# Patient Record
Sex: Female | Born: 1965 | Race: Black or African American | Hispanic: No | Marital: Single | Smoking: Never smoker
Health system: Southern US, Community
[De-identification: ages and names within clinical notes are randomized; demographics above are authoritative.]

## PROBLEM LIST (undated history)

## (undated) DIAGNOSIS — F329 Major depressive disorder, single episode, unspecified: Secondary | ICD-10-CM

## (undated) DIAGNOSIS — K219 Gastro-esophageal reflux disease without esophagitis: Secondary | ICD-10-CM

## (undated) DIAGNOSIS — K449 Diaphragmatic hernia without obstruction or gangrene: Secondary | ICD-10-CM

## (undated) DIAGNOSIS — E119 Type 2 diabetes mellitus without complications: Secondary | ICD-10-CM

## (undated) DIAGNOSIS — F411 Generalized anxiety disorder: Secondary | ICD-10-CM

## (undated) DIAGNOSIS — F988 Other specified behavioral and emotional disorders with onset usually occurring in childhood and adolescence: Secondary | ICD-10-CM

## (undated) DIAGNOSIS — D649 Anemia, unspecified: Secondary | ICD-10-CM

## (undated) DIAGNOSIS — E559 Vitamin D deficiency, unspecified: Secondary | ICD-10-CM

## (undated) DIAGNOSIS — G894 Chronic pain syndrome: Secondary | ICD-10-CM

## (undated) DIAGNOSIS — M797 Fibromyalgia: Secondary | ICD-10-CM

## (undated) DIAGNOSIS — E282 Polycystic ovarian syndrome: Secondary | ICD-10-CM

## (undated) DIAGNOSIS — F410 Panic disorder [episodic paroxysmal anxiety] without agoraphobia: Secondary | ICD-10-CM

## (undated) DIAGNOSIS — B002 Herpesviral gingivostomatitis and pharyngotonsillitis: Secondary | ICD-10-CM

## (undated) HISTORY — DX: Other specified behavioral and emotional disorders with onset usually occurring in childhood and adolescence: F98.8

## (undated) HISTORY — DX: Polycystic ovarian syndrome: E28.2

## (undated) HISTORY — PX: KNEE ARTHROSCOPY W/ MENISCAL REPAIR: SHX1877

## (undated) HISTORY — DX: Major depressive disorder, single episode, unspecified: F32.9

## (undated) HISTORY — DX: Gastro-esophageal reflux disease without esophagitis: K21.9

## (undated) HISTORY — DX: Diaphragmatic hernia without obstruction or gangrene: K44.9

## (undated) HISTORY — DX: Panic disorder (episodic paroxysmal anxiety): F41.0

## (undated) HISTORY — DX: Generalized anxiety disorder: F41.1

## (undated) HISTORY — DX: Chronic pain syndrome: G89.4

## (undated) HISTORY — DX: Vitamin D deficiency, unspecified: E55.9

## (undated) HISTORY — DX: Herpesviral gingivostomatitis and pharyngotonsillitis: B00.2

## (undated) HISTORY — DX: Anemia, unspecified: D64.9

## (undated) HISTORY — PX: GASTRIC BYPASS: SHX52

---

## 1994-01-21 ENCOUNTER — Encounter: Payer: Self-pay | Admitting: Gastroenterology

## 1999-08-11 ENCOUNTER — Other Ambulatory Visit: Admission: RE | Admit: 1999-08-11 | Discharge: 1999-08-11 | Payer: Self-pay | Admitting: Family Medicine

## 2000-07-01 ENCOUNTER — Encounter: Payer: Self-pay | Admitting: *Deleted

## 2000-07-01 ENCOUNTER — Encounter: Admission: RE | Admit: 2000-07-01 | Discharge: 2000-07-01 | Payer: Self-pay | Admitting: *Deleted

## 2000-07-03 ENCOUNTER — Emergency Department (HOSPITAL_COMMUNITY): Admission: EM | Admit: 2000-07-03 | Discharge: 2000-07-03 | Payer: Self-pay | Admitting: Emergency Medicine

## 2000-07-23 ENCOUNTER — Emergency Department (HOSPITAL_COMMUNITY): Admission: EM | Admit: 2000-07-23 | Discharge: 2000-07-23 | Payer: Self-pay | Admitting: Internal Medicine

## 2000-07-29 ENCOUNTER — Encounter: Admission: RE | Admit: 2000-07-29 | Discharge: 2000-07-29 | Payer: Self-pay | Admitting: Family Medicine

## 2000-07-29 ENCOUNTER — Encounter: Payer: Self-pay | Admitting: Family Medicine

## 2000-09-28 ENCOUNTER — Other Ambulatory Visit: Admission: RE | Admit: 2000-09-28 | Discharge: 2000-09-28 | Payer: Self-pay | Admitting: Family Medicine

## 2000-10-27 ENCOUNTER — Encounter: Admission: RE | Admit: 2000-10-27 | Discharge: 2001-01-25 | Payer: Self-pay | Admitting: Pulmonary Disease

## 2001-01-12 ENCOUNTER — Ambulatory Visit (HOSPITAL_BASED_OUTPATIENT_CLINIC_OR_DEPARTMENT_OTHER): Admission: RE | Admit: 2001-01-12 | Discharge: 2001-01-12 | Payer: Self-pay | Admitting: Pulmonary Disease

## 2001-10-13 ENCOUNTER — Encounter: Payer: Self-pay | Admitting: Family Medicine

## 2001-10-13 ENCOUNTER — Encounter: Admission: RE | Admit: 2001-10-13 | Discharge: 2001-10-13 | Payer: Self-pay | Admitting: Family Medicine

## 2001-10-27 ENCOUNTER — Encounter: Admission: RE | Admit: 2001-10-27 | Discharge: 2002-01-25 | Payer: Self-pay | Admitting: Pulmonary Disease

## 2002-05-31 ENCOUNTER — Encounter: Admission: RE | Admit: 2002-05-31 | Discharge: 2002-08-29 | Payer: Self-pay | Admitting: Pulmonary Disease

## 2002-08-15 ENCOUNTER — Other Ambulatory Visit: Admission: RE | Admit: 2002-08-15 | Discharge: 2002-08-15 | Payer: Self-pay | Admitting: Family Medicine

## 2002-08-22 ENCOUNTER — Encounter: Admission: RE | Admit: 2002-08-22 | Discharge: 2002-08-22 | Payer: Self-pay | Admitting: Family Medicine

## 2002-08-22 ENCOUNTER — Encounter: Payer: Self-pay | Admitting: Family Medicine

## 2002-09-07 ENCOUNTER — Encounter: Admission: RE | Admit: 2002-09-07 | Discharge: 2002-12-06 | Payer: Self-pay | Admitting: Pulmonary Disease

## 2002-10-10 ENCOUNTER — Emergency Department (HOSPITAL_COMMUNITY): Admission: EM | Admit: 2002-10-10 | Discharge: 2002-10-11 | Payer: Self-pay

## 2003-10-23 ENCOUNTER — Other Ambulatory Visit: Admission: RE | Admit: 2003-10-23 | Discharge: 2003-10-23 | Payer: Self-pay | Admitting: Family Medicine

## 2003-11-02 ENCOUNTER — Encounter: Admission: RE | Admit: 2003-11-02 | Discharge: 2003-11-02 | Payer: Self-pay | Admitting: Family Medicine

## 2004-04-22 ENCOUNTER — Encounter: Admission: RE | Admit: 2004-04-22 | Discharge: 2004-04-22 | Payer: Self-pay | Admitting: Orthopedic Surgery

## 2004-04-24 ENCOUNTER — Ambulatory Visit: Payer: Self-pay | Admitting: Family Medicine

## 2004-05-02 ENCOUNTER — Ambulatory Visit (HOSPITAL_COMMUNITY): Admission: RE | Admit: 2004-05-02 | Discharge: 2004-05-02 | Payer: Self-pay | Admitting: Orthopedic Surgery

## 2004-05-02 ENCOUNTER — Ambulatory Visit (HOSPITAL_BASED_OUTPATIENT_CLINIC_OR_DEPARTMENT_OTHER): Admission: RE | Admit: 2004-05-02 | Discharge: 2004-05-02 | Payer: Self-pay | Admitting: Orthopedic Surgery

## 2004-05-03 ENCOUNTER — Emergency Department (HOSPITAL_COMMUNITY): Admission: EM | Admit: 2004-05-03 | Discharge: 2004-05-03 | Payer: Self-pay | Admitting: Emergency Medicine

## 2004-12-18 ENCOUNTER — Ambulatory Visit: Payer: Self-pay | Admitting: Gastroenterology

## 2005-01-01 ENCOUNTER — Encounter: Admission: RE | Admit: 2005-01-01 | Discharge: 2005-04-01 | Payer: Self-pay | Admitting: Family Medicine

## 2005-01-05 ENCOUNTER — Encounter: Admission: RE | Admit: 2005-01-05 | Discharge: 2005-01-05 | Payer: Self-pay | Admitting: Family Medicine

## 2005-01-08 ENCOUNTER — Other Ambulatory Visit: Admission: RE | Admit: 2005-01-08 | Discharge: 2005-01-08 | Payer: Self-pay | Admitting: Family Medicine

## 2005-03-10 ENCOUNTER — Ambulatory Visit: Payer: Self-pay | Admitting: Gastroenterology

## 2005-03-30 ENCOUNTER — Encounter (HOSPITAL_COMMUNITY): Admission: RE | Admit: 2005-03-30 | Discharge: 2005-05-15 | Payer: Self-pay | Admitting: Gastroenterology

## 2006-02-26 ENCOUNTER — Ambulatory Visit (HOSPITAL_BASED_OUTPATIENT_CLINIC_OR_DEPARTMENT_OTHER): Admission: RE | Admit: 2006-02-26 | Discharge: 2006-02-26 | Payer: Self-pay | Admitting: Family Medicine

## 2006-02-28 ENCOUNTER — Ambulatory Visit: Payer: Self-pay | Admitting: Internal Medicine

## 2006-05-04 ENCOUNTER — Ambulatory Visit: Payer: Self-pay | Admitting: Pulmonary Disease

## 2007-01-03 ENCOUNTER — Encounter: Admission: RE | Admit: 2007-01-03 | Discharge: 2007-01-03 | Payer: Self-pay | Admitting: Family Medicine

## 2007-03-21 ENCOUNTER — Emergency Department (HOSPITAL_COMMUNITY): Admission: EM | Admit: 2007-03-21 | Discharge: 2007-03-21 | Payer: Self-pay | Admitting: Emergency Medicine

## 2007-05-23 ENCOUNTER — Encounter: Admission: RE | Admit: 2007-05-23 | Discharge: 2007-05-23 | Payer: Self-pay | Admitting: Family Medicine

## 2007-11-24 ENCOUNTER — Encounter: Admission: RE | Admit: 2007-11-24 | Discharge: 2007-11-24 | Payer: Self-pay | Admitting: Family Medicine

## 2008-10-29 ENCOUNTER — Ambulatory Visit: Payer: Self-pay | Admitting: Hematology and Oncology

## 2008-11-06 LAB — CBC & DIFF AND RETIC
Basophils Absolute: 0 10*3/uL (ref 0.0–0.1)
EOS%: 0 % (ref 0.0–7.0)
MCH: 21 pg — ABNORMAL LOW (ref 25.1–34.0)
MCHC: 30.7 g/dL — ABNORMAL LOW (ref 31.5–36.0)
MCV: 68.5 fL — ABNORMAL LOW (ref 79.5–101.0)
MONO#: 0.4 10*3/uL (ref 0.1–0.9)
MONO%: 8 % (ref 0.0–14.0)
NEUT%: 57.7 % (ref 38.4–76.8)
RBC: 4.38 10*6/uL (ref 3.70–5.45)
RDW: 18.3 % — ABNORMAL HIGH (ref 11.2–14.5)
Retic %: 1.5 % (ref 0.4–2.3)
lymph#: 1.8 10*3/uL (ref 0.9–3.3)
nRBC: 0 % (ref 0–0)

## 2008-11-08 LAB — PROTEIN ELECTROPHORESIS, SERUM, WITH REFLEX
Albumin ELP: 49.4 % — ABNORMAL LOW (ref 55.8–66.1)
Alpha-1-Globulin: 4.7 % (ref 2.9–4.9)
Alpha-2-Globulin: 11.8 % (ref 7.1–11.8)
Beta 2: 5.7 % (ref 3.2–6.5)
Beta Globulin: 7.4 % — ABNORMAL HIGH (ref 4.7–7.2)
Gamma Globulin: 21 % — ABNORMAL HIGH (ref 11.1–18.8)

## 2008-11-08 LAB — COMPREHENSIVE METABOLIC PANEL
Albumin: 3.5 g/dL (ref 3.5–5.2)
Alkaline Phosphatase: 120 U/L — ABNORMAL HIGH (ref 39–117)
Calcium: 9.3 mg/dL (ref 8.4–10.5)
Potassium: 4.1 mEq/L (ref 3.5–5.3)
Sodium: 138 mEq/L (ref 135–145)
Total Bilirubin: 0.2 mg/dL — ABNORMAL LOW (ref 0.3–1.2)
Total Protein: 6.8 g/dL (ref 6.0–8.3)

## 2008-11-08 LAB — LACTATE DEHYDROGENASE: LDH: 121 U/L (ref 94–250)

## 2008-12-10 DIAGNOSIS — J449 Chronic obstructive pulmonary disease, unspecified: Secondary | ICD-10-CM

## 2008-12-10 DIAGNOSIS — F329 Major depressive disorder, single episode, unspecified: Secondary | ICD-10-CM

## 2008-12-10 DIAGNOSIS — IMO0001 Reserved for inherently not codable concepts without codable children: Secondary | ICD-10-CM | POA: Insufficient documentation

## 2008-12-10 DIAGNOSIS — E669 Obesity, unspecified: Secondary | ICD-10-CM

## 2008-12-10 DIAGNOSIS — J4489 Other specified chronic obstructive pulmonary disease: Secondary | ICD-10-CM | POA: Insufficient documentation

## 2008-12-10 DIAGNOSIS — D509 Iron deficiency anemia, unspecified: Secondary | ICD-10-CM

## 2008-12-10 DIAGNOSIS — F410 Panic disorder [episodic paroxysmal anxiety] without agoraphobia: Secondary | ICD-10-CM

## 2008-12-10 DIAGNOSIS — K219 Gastro-esophageal reflux disease without esophagitis: Secondary | ICD-10-CM

## 2008-12-10 DIAGNOSIS — E162 Hypoglycemia, unspecified: Secondary | ICD-10-CM

## 2008-12-10 DIAGNOSIS — F988 Other specified behavioral and emotional disorders with onset usually occurring in childhood and adolescence: Secondary | ICD-10-CM | POA: Insufficient documentation

## 2008-12-10 DIAGNOSIS — E119 Type 2 diabetes mellitus without complications: Secondary | ICD-10-CM | POA: Insufficient documentation

## 2008-12-10 DIAGNOSIS — K449 Diaphragmatic hernia without obstruction or gangrene: Secondary | ICD-10-CM | POA: Insufficient documentation

## 2008-12-10 DIAGNOSIS — E282 Polycystic ovarian syndrome: Secondary | ICD-10-CM

## 2008-12-17 ENCOUNTER — Ambulatory Visit: Payer: Self-pay | Admitting: Gastroenterology

## 2008-12-25 ENCOUNTER — Ambulatory Visit: Payer: Self-pay | Admitting: Hematology and Oncology

## 2009-02-12 ENCOUNTER — Ambulatory Visit: Payer: Self-pay | Admitting: Hematology and Oncology

## 2009-09-01 ENCOUNTER — Emergency Department (HOSPITAL_COMMUNITY): Admission: EM | Admit: 2009-09-01 | Discharge: 2009-09-01 | Payer: Self-pay | Admitting: Family Medicine

## 2009-09-02 ENCOUNTER — Emergency Department (HOSPITAL_COMMUNITY): Admission: EM | Admit: 2009-09-02 | Discharge: 2009-09-02 | Payer: Self-pay | Admitting: Emergency Medicine

## 2010-04-22 ENCOUNTER — Encounter: Admission: RE | Admit: 2010-04-22 | Discharge: 2010-04-22 | Payer: Self-pay | Admitting: Family Medicine

## 2010-05-23 ENCOUNTER — Encounter
Admission: RE | Admit: 2010-05-23 | Discharge: 2010-05-23 | Payer: Self-pay | Source: Home / Self Care | Attending: Family Medicine | Admitting: Family Medicine

## 2010-06-08 ENCOUNTER — Encounter: Payer: Self-pay | Admitting: Family Medicine

## 2010-06-09 ENCOUNTER — Encounter: Payer: Self-pay | Admitting: Family Medicine

## 2010-06-18 ENCOUNTER — Encounter: Payer: Self-pay | Admitting: Family Medicine

## 2010-10-03 NOTE — Procedures (Signed)
NAME:  Katrina Ellison, SPENGLER NO.:  [id number]   MEDICAL RECORD NO.:  [id number]          PATIENT TYPE:  OUT   LOCATION:  SLEEP CENTER                 FACILITY:  [facility]   PHYSICIAN:  Clinton D. Maple Hudson, MD, FCCP, FACPDATE OF BIRTH:  October 20, 1966   DATE OF STUDY:  02/26/2006                              NOCTURNAL POLYSOMNOGRAM   REFERRING PHYSICIAN:  Talmadge Coventry, M.D.   INDICATION FOR STUDY:  Insomnia with sleep apnea.   EPWORTH SLEEPINESS SCORE:  10/24.  BMI 48.7.  Weight 286 pounds.   MEDICATIONS:  Tramadol, Lamictal, Meloxicam.   SLEEP ARCHITECTURE:  Short total sleep time 267 minutes with sleep  efficiency 71%.  Stage I was 4%.  Stage II 48%.  Stages III and IV 16%.  REM  33% of total sleep time.  Sleep latency 10 minutes.  REM latency 39 minutes.  Awake after sleep onset 86 minutes.  Arousal index 14.  No bedtime  medication taken, but she did take Tramadol at 4:10 a.m. for pain and could  not go back to sleep.   RESPIRATORY DATA:  NPSG protocol was ordered.  Apnea/hypopnea index  (AHI/RDI) 4.7 obstructive events per hour which is within normal limits  (normal range 0-5 per hour).  There were 5 obstructive apneas and 16  hypopneas.  Most events were recorded while sleeping supine.  REM AHI 8.8  per hour.   OXYGEN DATA:  Moderate snoring and mouth breathing with oxygen desaturation  to a nadir of 92%.  Mean oxygen saturation through the study was 96% on room  air.   CARDIAC DATA:  Normal sinus rhythm.   MOVEMENT-PARASOMNIA:  Occasional limb jerk with arousal, probably not  significant.   IMPRESSIONS-RECOMMENDATIONS:  1. Short total sleep time with significant note of Tramadol taken at 4:10      a.m. for pain and inability to regain sleep after that.  2. Occasional sleep disorder breathing events, AHI 4.7 per hour (normal      range 0-5 per hour).  Most events are while sleeping supine which was      her preferred sleep position.  Snoring was moderate with  oxygen      desaturation to a nadir of 92%.  3. Therapeutic effort would seem best directed to weight loss and sleep      off of flat of back.  She would not qualify for CPAP by standard      guidelines.      Clinton D. Maple Hudson, MD, [facility], FACP  Diplomate, Biomedical engineer of Sleep Medicine  Electronically Signed     CDY/MEDQ  D:  02/28/2006 13:03:21  T:  03/01/2006 13:49:07  Job:  [id number]

## 2010-10-03 NOTE — Op Note (Signed)
Katrina Ellison, SYFERT NO.:  [id number]   MEDICAL RECORD NO.:  [id number]          PATIENT TYPE:  AMB   LOCATION:  DSC                          FACILITY:  [facility]   PHYSICIAN:  Thera Flake., M.D.DATE OF BIRTH:  01-21-1966   DATE OF PROCEDURE:  DATE OF DISCHARGE:                                 OPERATIVE REPORT   INDICATIONS:  This is a 45 year old female with an MRI suggesting meniscal  tear, thought to be amenable to outpatient surgery.   PREOPERATIVE DIAGNOSIS:  1.  Torn lateral meniscus, right knee.  2.  Chondromalacia, patella (grade III, central and medial).   POSTOPERATIVE DIAGNOSIS:  1.  Torn lateral meniscus, right knee.  2.  Chondromalacia, patella (grade III, central and medial).   PROCEDURE:  1.  Partial lateral meniscectomy.  2.  Debridement chondroplasty, patellofemoral joint.   SURGEON:  Dyke Brackett, M.D.   ANESTHESIA:  MAC/general.   DESCRIPTION OF PROCEDURE:  She is arthroscoped inferomedially and  inferolaterally with an accessory mid medial portal for debridement of  patella.  The patient's articular surfaces on the tibial/femoral  articulations appeared relatively normal.  There was some moderate softening  of the lateral compartment.  The medial meniscus was brought up and in my  recollection of an MRI suggesting a tear there was normal.  The lateral  meniscus showed a complex tear.  The midportion of the lateral meniscus  requiring resection of probably 20-30% of the meniscus substance.  There was  minor debridement of the joint on this level but major debridement apart  from the lateral compartment on the patellofemoral surface, relative on the  patella medially and centrally with advanced grade III changes being  debrided back to a nice smooth rim.  Again, debridement of the  patellofemoral compartment was carried out separately from the lateral  meniscectomy.  Knee was drained free of fluid.  The portals were closed with  nylon, infiltrated Marcaine, morphine, addition of 80 mg of Depo-Medrol,  taken recovery room in stable condition.      Margarito Liner   WDC/MEDQ  D:  05/02/2004  T:  05/03/2004  Job:  [id number]
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
## 2010-10-29 ENCOUNTER — Other Ambulatory Visit: Payer: Self-pay | Admitting: Internal Medicine

## 2010-10-29 DIAGNOSIS — R921 Mammographic calcification found on diagnostic imaging of breast: Secondary | ICD-10-CM

## 2010-11-25 ENCOUNTER — Ambulatory Visit
Admission: RE | Admit: 2010-11-25 | Discharge: 2010-11-25 | Disposition: A | Discharge status: Home / Self Care | Payer: BC Managed Care – PPO | Source: Ambulatory Visit | Attending: Internal Medicine | Admitting: Internal Medicine

## 2010-11-25 DIAGNOSIS — R921 Mammographic calcification found on diagnostic imaging of breast: Secondary | ICD-10-CM

## 2012-03-11 ENCOUNTER — Emergency Department (HOSPITAL_COMMUNITY)
Admission: EM | Admit: 2012-03-11 | Discharge: 2012-03-11 | Disposition: A | Discharge status: Home / Self Care | Payer: BC Managed Care – PPO | Attending: Emergency Medicine | Admitting: Emergency Medicine

## 2012-03-11 ENCOUNTER — Emergency Department (HOSPITAL_COMMUNITY): Payer: BC Managed Care – PPO

## 2012-03-11 DIAGNOSIS — A084 Viral intestinal infection, unspecified: Secondary | ICD-10-CM

## 2012-03-11 DIAGNOSIS — Z9884 Bariatric surgery status: Secondary | ICD-10-CM | POA: Insufficient documentation

## 2012-03-11 DIAGNOSIS — R51 Headache: Secondary | ICD-10-CM | POA: Insufficient documentation

## 2012-03-11 DIAGNOSIS — Z8639 Personal history of other endocrine, nutritional and metabolic disease: Secondary | ICD-10-CM | POA: Insufficient documentation

## 2012-03-11 DIAGNOSIS — R109 Unspecified abdominal pain: Secondary | ICD-10-CM | POA: Insufficient documentation

## 2012-03-11 DIAGNOSIS — Z3202 Encounter for pregnancy test, result negative: Secondary | ICD-10-CM | POA: Insufficient documentation

## 2012-03-11 DIAGNOSIS — Z862 Personal history of diseases of the blood and blood-forming organs and certain disorders involving the immune mechanism: Secondary | ICD-10-CM | POA: Insufficient documentation

## 2012-03-11 DIAGNOSIS — Z79899 Other long term (current) drug therapy: Secondary | ICD-10-CM | POA: Insufficient documentation

## 2012-03-11 DIAGNOSIS — M797 Fibromyalgia: Secondary | ICD-10-CM

## 2012-03-11 DIAGNOSIS — IMO0001 Reserved for inherently not codable concepts without codable children: Secondary | ICD-10-CM | POA: Insufficient documentation

## 2012-03-11 DIAGNOSIS — A088 Other specified intestinal infections: Secondary | ICD-10-CM | POA: Insufficient documentation

## 2012-03-11 DIAGNOSIS — R110 Nausea: Secondary | ICD-10-CM | POA: Insufficient documentation

## 2012-03-11 DIAGNOSIS — R079 Chest pain, unspecified: Secondary | ICD-10-CM | POA: Insufficient documentation

## 2012-03-11 DIAGNOSIS — M79603 Pain in arm, unspecified: Secondary | ICD-10-CM

## 2012-03-11 LAB — URINALYSIS, ROUTINE W REFLEX MICROSCOPIC
Bilirubin Urine: NEGATIVE
Hgb urine dipstick: NEGATIVE
Ketones, ur: NEGATIVE mg/dL
Protein, ur: NEGATIVE mg/dL
Urobilinogen, UA: 0.2 mg/dL (ref 0.0–1.0)

## 2012-03-11 LAB — COMPREHENSIVE METABOLIC PANEL
Alkaline Phosphatase: 140 U/L — ABNORMAL HIGH (ref 39–117)
BUN: 10 mg/dL (ref 6–23)
Calcium: 9.7 mg/dL (ref 8.4–10.5)
Creatinine, Ser: 0.64 mg/dL (ref 0.50–1.10)
GFR calc Af Amer: >90 mL/min (ref >90)
Glucose, Bld: 112 mg/dL — ABNORMAL HIGH (ref 70–99)
Total Protein: 7.4 g/dL (ref 6.0–8.3)

## 2012-03-11 LAB — CBC
HCT: 38.9 % (ref 36.0–46.0)
MCHC: 32.6 g/dL (ref 30.0–36.0)
MCV: 81.7 fL (ref 78.0–100.0)
Platelets: 395 10*3/uL (ref 150–400)
RDW: 13.8 % (ref 11.5–15.5)

## 2012-03-11 LAB — PREGNANCY, URINE: Preg Test, Ur: NEGATIVE

## 2012-03-11 LAB — POCT I-STAT TROPONIN I: Troponin i, poc: 0.03 ng/mL (ref 0.00–0.08)

## 2012-03-11 LAB — LIPASE, BLOOD: Lipase: 20 U/L (ref 11–59)

## 2012-03-11 NOTE — ED Notes (Signed)
Only has pain when palpated

## 2012-03-11 NOTE — ED Provider Notes (Signed)
I saw and evaluated the patient, reviewed the resident's note and I agree with the findings and plan. Pain is musculoskeletal and reproduced with palpation.   Loren Racer, MD 03/11/12 1139

## 2012-03-11 NOTE — ED Provider Notes (Signed)
History     CSN: [id number]  Arrival date & time 03/11/12  4098   First MD Initiated Contact with Patient 03/11/12 0750      Chief Complaint  Patient presents with  . Arm Pain  . Chest Pain    (Consider location/radiation/quality/duration/timing/severity/associated sxs/prior treatment) HPI CC: arm pain, stomach pain, HA, Nausea, CP  Pt is a 46yo AAF w/ 1 wk h/o R arm pain, stomach pain, feeling shaky, HA, nausea, tired and CP. Pt reports that symptoms improve overall with sleep and w/ ibuprofen or execedrin. Daily diarrhea in the am during this time. Denies any vaginal discharge, and is not sexually active. Arm pain starts in shoulder and radiates to her hand. Some hand weakness on R when shoulder hurts. Arm pain improves when held in certain positions but worsens w/ certain movements. Denies any trauma to that shoulder/arm but does sleep on it from time to time.  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 No flu shot this year. Menstrual period ended 5 days ago.  Denies palpitations, syncope, lightheadedness, emesis, constipation.  Pt took BS this am and noted it to be 233.  PMHx:  Fibromyalgia DM (resolved after gastric bypass in 2003)  PSHx:  Gastric bypass in 2003 Knee surgery   Allergies: codeine makes pt nauseous  No past medical history on file.  No past surgical history on file.  No family history on file.  History  Substance Use Topics  . Smoking status: Not on file  . Smokeless tobacco: Not on file  . Alcohol Use: Not on file    OB History    No data available      Review of Systems  All other systems reviewed and are negative.    Allergies  Codeine  Home Medications   Current Outpatient Rx  Name Route Sig Dispense Refill  . TENSION HEADACHE PO Oral Take 2 tablets by mouth daily as needed. For headache    . ALPRAZOLAM 0.25 MG PO TABS Oral Take 0.125 mg by mouth 3 (three) times daily as needed. For anxiety    . BUPROPION HCL 100 MG PO TABS Oral  Take 100 mg by mouth 3 (three) times daily.    Marland Kitchen DEXTROAMPHETAMINE SULFATE 10 MG PO TABS Oral Take 5 mg by mouth 3 (three) times daily.    . IBUPROFEN 200 MG PO TABS Oral Take 800 mg by mouth every 8 (eight) hours as needed. For pain    . SERTRALINE HCL 100 MG PO TABS Oral Take 200 mg by mouth daily.    . TRAMADOL HCL 50 MG PO TABS Oral Take 50 mg by mouth every 8 (eight) hours as needed. For pain      BP 100/81  Pulse 84  Temp 98.3 F (36.8 C) (Oral)  Resp 19  SpO2 96%  LMP 03/01/2012  Physical Exam  Nursing note and vitals reviewed. Constitutional: She is oriented to person, place, and time. She appears well-developed and well-nourished. No distress.  HENT:  Head: Normocephalic and atraumatic.  Eyes: Pupils are equal, round, and reactive to light.  Neck: Normal range of motion.  Cardiovascular: Normal rate, regular rhythm, normal heart sounds and intact distal pulses.   No murmur heard. Pulmonary/Chest: Effort normal and breath sounds normal. No respiratory distress. She has no wheezes. She has no rales. She exhibits no tenderness.  Abdominal: Soft. Normal appearance and bowel sounds are normal. She exhibits no distension.  Musculoskeletal: Normal range of motion. She exhibits no edema.  Point tenderness on palpation of R shoulder w/o radiation down the arm. Hand grip and arm strength 3+ bilat Hawkins test normal Empty can test normal   Neurological: She is alert and oriented to person, place, and time. No cranial nerve deficit.  Skin: Skin is warm and dry. No rash noted.  Psychiatric: She has a normal mood and affect. Her behavior is normal.    ED Course  Procedures (including critical care time)  Labs Reviewed  COMPREHENSIVE METABOLIC PANEL - Abnormal; Notable for the following:    Glucose, Bld 112 (*)     Albumin 3.4 (*)     Alkaline Phosphatase 140 (*)     Total Bilirubin 0.2 (*)     All other components within normal limits  CBC  URINALYSIS, ROUTINE W  REFLEX MICROSCOPIC  LIPASE, BLOOD  PREGNANCY, URINE  POCT I-STAT TROPONIN I  POCT PREGNANCY, URINE   Dg Chest 2 View  03/11/2012  *RADIOLOGY REPORT*  Clinical Data: Chest pain, weakness and dizziness  CHEST - 2 VIEW  Comparison: 10/11/2002  Findings: The heart, mediastinum and hila are within normal limits. The lungs are clear.  No pleural effusion or pneumothorax.  The bony thorax and surrounding soft tissues are unremarkable.  IMPRESSION: Normal chest radiographs   Original Report Authenticated By: Domenic Moras, M.D.      No diagnosis found.    MDM  46yo AAF w/ PMHx significant for gastric bypass, DM, and fibromyalgia w/ a constellation of symptoms likely related to her fibromyalgia, musculoskeletal pain from obesity, viral gastroenteritis. EKG normal - CMET, CBC, Lipase, UA, troponins - Urine pregnancy   Update: lab work as above. Likely w/ viral gastroenteritis w/ fibromyalgia flare w/ musculoskeletal pain.  - Pt already w/ check-up with PCP set for next week. - Ibuprofen for pain - Pt to take Reglan at home for nausea  Shelly Flatten, MD Family Medicine PGY-2 03/11/2012, 10:08 AM        Ozella Rocks, MD 03/11/12 [phone]

## 2012-03-11 NOTE — ED Notes (Signed)
Pt having some chest pain, but most pain is located in right shoulder and tender to palpation.

## 2013-06-28 ENCOUNTER — Emergency Department (INDEPENDENT_AMBULATORY_CARE_PROVIDER_SITE_OTHER): Payer: BC Managed Care – PPO

## 2013-06-28 ENCOUNTER — Encounter (HOSPITAL_COMMUNITY): Payer: Self-pay | Admitting: Emergency Medicine

## 2013-06-28 ENCOUNTER — Emergency Department (HOSPITAL_COMMUNITY)
Admission: EM | Admit: 2013-06-28 | Discharge: 2013-06-28 | Disposition: A | Discharge status: Home / Self Care | Payer: BC Managed Care – PPO | Source: Home / Self Care | Attending: Family Medicine | Admitting: Family Medicine

## 2013-06-28 DIAGNOSIS — M1611 Unilateral primary osteoarthritis, right hip: Secondary | ICD-10-CM

## 2013-06-28 DIAGNOSIS — M797 Fibromyalgia: Secondary | ICD-10-CM | POA: Insufficient documentation

## 2013-06-28 DIAGNOSIS — M1610 Unilateral primary osteoarthritis, unspecified hip: Secondary | ICD-10-CM

## 2013-06-28 HISTORY — DX: Fibromyalgia: M79.7

## 2013-06-28 LAB — POCT URINALYSIS DIP (DEVICE)
BILIRUBIN URINE: NEGATIVE
GLUCOSE, UA: NEGATIVE mg/dL
HGB URINE DIPSTICK: NEGATIVE
Ketones, ur: NEGATIVE mg/dL
Leukocytes, UA: NEGATIVE
NITRITE: NEGATIVE
Protein, ur: NEGATIVE mg/dL
SPECIFIC GRAVITY, URINE: 1.025 (ref 1.005–1.030)
UROBILINOGEN UA: 1 mg/dL (ref 0.0–1.0)
pH: 7 (ref 5.0–8.0)

## 2013-06-28 MED ORDER — MELOXICAM 7.5 MG PO TABS: 7.5000 mg | ORAL_TABLET | Freq: Every day | ORAL | Status: DC

## 2013-06-28 NOTE — Discharge Instructions (Signed)
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 You may use medication as prescribed today for your pain. You would benefit greatly from weight loss in an effort to improve overall health and slow the progression of joint degeneration of your right hip. I would encourage you to follow up with the orthopedics office recommended by your doctor for further evaluation and management.

## 2013-06-28 NOTE — ED Notes (Signed)
C/o pain in her right side /hip for 1 month; reportedly has an appointment to see an orthopaedist in 1 month

## 2013-06-28 NOTE — ED Provider Notes (Signed)
CSN: [id number]     Arrival date & time 06/28/13  1045 History   First MD Initiated Contact with Patient 06/28/13 1159     Chief Complaint  Patient presents with  . Hip Pain     (Consider location/radiation/quality/duration/timing/severity/associated sxs/prior Treatment) HPI Comments: Patient presents to UC with 1 month history of right hip pain. Pain radiates to right groin and is exacerbated with transitions from sitting to standing. Has been evaluated by PCP (Dr. Fanny Dance) twice for same. Underwent CT A/P on 06/07/2013 and results were reported to the patient as normal. Was referred to orthopedics ([facility]) by her PCP, but states when she called for appointment she was unable to be seen until March 10th, 2015.  Reports she has been taking tramadol as prescribed but remains uncomfortable. Denies any issue with bowels or bladder. No changes in strength or sensation of lower extremities. No reported illness or injury. Patient states she spoke with her PCP this morning and it was suggested to patient that she have xrays of right hip.   Patient is a 48 y.o. female presenting with hip pain. The history is provided by the patient.  Hip Pain    Past Medical History  Diagnosis Date  . Fibromyalgia    Past Surgical History  Procedure Laterality Date  . Gastric bypass     History reviewed. No pertinent family history. History  Substance Use Topics  . Smoking status: Never Smoker   . Smokeless tobacco: Not on file  . Alcohol Use: Not on file   OB History   Grav Para Term Preterm Abortions TAB SAB Ect Mult Living                 Review of Systems  All other systems reviewed and are negative.      Allergies  Codeine  Home Medications   Current Outpatient Rx  Name  Route  Sig  Dispense  Refill  . buPROPion (WELLBUTRIN) 100 MG tablet   Oral   Take 100 mg by mouth 3 (three) times daily.         . sertraline (ZOLOFT) 100 MG tablet   Oral   Take 200 mg by mouth  daily.         . Acetaminophen-Caffeine (TENSION HEADACHE PO)   Oral   Take 2 tablets by mouth daily as needed. For headache         . ALPRAZolam (XANAX) 0.25 MG tablet   Oral   Take 0.125 mg by mouth 3 (three) times daily as needed. For anxiety         . dextroamphetamine (DEXTROSTAT) 10 MG tablet   Oral   Take 5 mg by mouth 3 (three) times daily.         Marland Kitchen ibuprofen (ADVIL,MOTRIN) 200 MG tablet   Oral   Take 800 mg by mouth every 8 (eight) hours as needed. For pain         . traMADol (ULTRAM) 50 MG tablet   Oral   Take 50 mg by mouth every 8 (eight) hours as needed. For pain          BP 129/48  Pulse 84  Temp(Src) 98 F (36.7 C) (Oral)  Resp 20  SpO2 97% Physical Exam  Nursing note and vitals reviewed. Constitutional: She is oriented to person, place, and time. She appears well-developed and well-nourished. No distress.  +morbidly obese  HENT:  Head: Normocephalic and atraumatic.  Eyes: Conjunctivae are normal.  Cardiovascular: Normal  rate, regular rhythm and normal heart sounds.   Pulmonary/Chest: Effort normal and breath sounds normal.  Abdominal: Soft. Bowel sounds are normal. She exhibits no distension. There is no tenderness.  Musculoskeletal: Normal range of motion.       Right hip: She exhibits tenderness and bony tenderness. She exhibits normal range of motion, normal strength, no swelling, no crepitus, no deformity and no laceration.       Legs: CSM exam of RLE intact. +point tenderness at right SI joint.  Neurological: She is alert and oriented to person, place, and time.  Skin: Skin is warm and dry. No rash noted. No erythema.  Psychiatric: She has a normal mood and affect. Her behavior is normal.    ED Course  Procedures (including critical care time) Labs Review Labs Reviewed - No data to display Imaging Review No results found.    MDM   Final diagnoses:  None  1. Right hip pain: films suggestive of DJD at right hip without  evidence of AVN. Patient is likely dealing with right hip arthritis and would certainly benefit from weight loss. Will provide Rx for Mobic to use for discomfort and encourage follow up with orthopedics for further management.   Jess BartersJennifer Lee EdgewoodPresson, GeorgiaPA 06/28/13 1336

## 2013-06-29 NOTE — ED Provider Notes (Signed)
Medical screening examination/treatment/procedure(s) were performed by a resident physician or non-physician practitioner and as the supervising physician I was immediately available for consultation/collaboration.  Nilah Belcourt, MD    Diamone Whistler S Akacia Boltz, MD 06/29/13 2114 

## 2015-06-13 ENCOUNTER — Emergency Department (HOSPITAL_COMMUNITY)
Admission: EM | Admit: 2015-06-13 | Discharge: 2015-06-13 | Disposition: A | Discharge status: Home / Self Care | Payer: BC Managed Care – PPO | Attending: Emergency Medicine | Admitting: Emergency Medicine

## 2015-06-13 ENCOUNTER — Encounter (HOSPITAL_COMMUNITY): Payer: Self-pay | Admitting: *Deleted

## 2015-06-13 ENCOUNTER — Emergency Department (HOSPITAL_COMMUNITY): Payer: BC Managed Care – PPO

## 2015-06-13 DIAGNOSIS — R42 Dizziness and giddiness: Secondary | ICD-10-CM | POA: Diagnosis not present

## 2015-06-13 DIAGNOSIS — Z9884 Bariatric surgery status: Secondary | ICD-10-CM | POA: Insufficient documentation

## 2015-06-13 DIAGNOSIS — R51 Headache: Secondary | ICD-10-CM | POA: Diagnosis not present

## 2015-06-13 DIAGNOSIS — R202 Paresthesia of skin: Secondary | ICD-10-CM | POA: Insufficient documentation

## 2015-06-13 DIAGNOSIS — Z79899 Other long term (current) drug therapy: Secondary | ICD-10-CM | POA: Insufficient documentation

## 2015-06-13 DIAGNOSIS — R519 Headache, unspecified: Secondary | ICD-10-CM

## 2015-06-13 LAB — I-STAT CHEM 8, ED
BUN: 12 mg/dL (ref 6–20)
CREATININE: 0.8 mg/dL (ref 0.44–1.00)
Calcium, Ion: 1.24 mmol/L — ABNORMAL HIGH (ref 1.12–1.23)
Chloride: 107 mmol/L (ref 101–111)
GLUCOSE: 130 mg/dL — AB (ref 65–99)
HEMATOCRIT: 39 % (ref 36.0–46.0)
HEMOGLOBIN: 13.3 g/dL (ref 12.0–15.0)
Potassium: 3.9 mmol/L (ref 3.5–5.1)
Sodium: 140 mmol/L (ref 135–145)
TCO2: 22 mmol/L (ref 0–100)

## 2015-06-13 LAB — COMPREHENSIVE METABOLIC PANEL
ALT: 20 U/L (ref 14–54)
AST: 19 U/L (ref 15–41)
Albumin: 3.2 g/dL — ABNORMAL LOW (ref 3.5–5.0)
Alkaline Phosphatase: 135 U/L — ABNORMAL HIGH (ref 38–126)
Anion gap: 8 (ref 5–15)
BUN: 10 mg/dL (ref 6–20)
CO2: 24 mmol/L (ref 22–32)
Calcium: 9.6 mg/dL (ref 8.9–10.3)
Chloride: 107 mmol/L (ref 101–111)
Creatinine, Ser: 0.82 mg/dL (ref 0.44–1.00)
GFR calc Af Amer: >60 mL/min (ref >60)
Glucose, Bld: 130 mg/dL — ABNORMAL HIGH (ref 65–99)
POTASSIUM: 3.9 mmol/L (ref 3.5–5.1)
Sodium: 139 mmol/L (ref 135–145)
TOTAL PROTEIN: 7 g/dL (ref 6.5–8.1)

## 2015-06-13 LAB — CBC
HEMATOCRIT: 35.3 % — AB (ref 36.0–46.0)
HEMOGLOBIN: 11 g/dL — AB (ref 12.0–15.0)
MCH: 24.4 pg — AB (ref 26.0–34.0)
MCHC: 31.2 g/dL (ref 30.0–36.0)
MCV: 78.3 fL (ref 78.0–100.0)
Platelets: 425 10*3/uL — ABNORMAL HIGH (ref 150–400)
RBC: 4.51 MIL/uL (ref 3.87–5.11)
RDW: 15.7 % — ABNORMAL HIGH (ref 11.5–15.5)
WBC: 7.8 10*3/uL (ref 4.0–10.5)

## 2015-06-13 LAB — DIFFERENTIAL
Basophils Absolute: 0 10*3/uL (ref 0.0–0.1)
Basophils Relative: 0 %
EOS ABS: 0 10*3/uL (ref 0.0–0.7)
EOS PCT: 0 %
LYMPHS ABS: 2.7 10*3/uL (ref 0.7–4.0)
Lymphocytes Relative: 35 %
MONO ABS: 0.5 10*3/uL (ref 0.1–1.0)
MONOS PCT: 6 %
Neutro Abs: 4.6 10*3/uL (ref 1.7–7.7)
Neutrophils Relative %: 59 %

## 2015-06-13 LAB — APTT: aPTT: 28 seconds (ref 24–37)

## 2015-06-13 LAB — PROTIME-INR
INR: 1.03 (ref 0.00–1.49)
Prothrombin Time: 13.7 seconds (ref 11.6–15.2)

## 2015-06-13 LAB — I-STAT TROPONIN, ED: TROPONIN I, POC: 0.01 ng/mL (ref 0.00–0.08)

## 2015-06-13 MED ORDER — SODIUM CHLORIDE 0.9 % IV BOLUS (SEPSIS): 1000.0000 mL | Freq: Once | INTRAVENOUS | Status: DC

## 2015-06-13 MED ORDER — PROCHLORPERAZINE EDISYLATE 5 MG/ML IJ SOLN: 10.0000 mg | Freq: Four times a day (QID) | INTRAMUSCULAR | Status: DC | PRN

## 2015-06-13 MED ORDER — METHYLPREDNISOLONE SODIUM SUCC 125 MG IJ SOLR: 125.0000 mg | Freq: Once | INTRAMUSCULAR | Status: DC

## 2015-06-13 MED ORDER — DIPHENHYDRAMINE HCL 50 MG/ML IJ SOLN: 25.0000 mg | Freq: Once | INTRAMUSCULAR | Status: DC

## 2015-06-13 MED ORDER — KETOROLAC TROMETHAMINE 15 MG/ML IJ SOLN: 30.0000 mg | Freq: Once | INTRAMUSCULAR | Status: DC

## 2015-06-13 NOTE — ED Notes (Signed)
Pt reports rt sided headache that started today. Pt states that she noticed some tingling to her face as well. Pt has no neuro deficits at triage. Pt noted to have left eye droop that is her baseline. Pt ws evaluated in triage by dr Ranae Palms.

## 2015-06-13 NOTE — Discharge Instructions (Signed)
You have been seen today for a headache and tingling in your extremities. Your lab tests showed no abnormalities. Follow up with PCP as needed. Return to ED should symptoms worsen.

## 2015-06-13 NOTE — ED Notes (Signed)
No code stroke activated per Dr. Ranae Palms.   Dr. Ranae Palms went to triage and assessed patient.  Patient okayd to be triaged.

## 2015-06-13 NOTE — ED Provider Notes (Signed)
Medical screening examination/treatment/procedure(s) were conducted as a shared visit with non-physician practitioner(s) and myself.  I personally evaluated the patient during the encounter.   EKG Interpretation None     49 year old female presents with headache and right-sided uncomplicated paresthesia in both upper and lower extremities. No acute distress on exam. Negative head CT despite ongoing symptoms here. Patient's clinical presentation is not consistent with acute ischemic stroke. More likely complicated migraine, treated with migraine cocktail and monitored in the emergency department with plan for discharge and primary care follow-up or return with worsening.  Pt eloped.  See related encounter note   Lyndal Pulley, MD 06/14/15 [phone]

## 2015-06-13 NOTE — ED Provider Notes (Signed)
CSN: [id number]     Arrival date & time 06/13/15  1246 History   First MD Initiated Contact with Patient 06/13/15 1626     Chief Complaint  Patient presents with  . Headache     (Consider location/radiation/quality/duration/timing/severity/associated sxs/prior Treatment) HPI   Katrina Ellison is a 50 y.o. female, with a history of fibromyalgia, presenting to the ED with headache, upper and lower extremity tingling and burning, and dizziness. Patient states that the symptoms began around noon today. Patient describes the headache as a throbbing, located on the right side of her head near the temple, rates it 5 out of 10, and nonradiating. Patient states that this headache feels like any other headache she said in the past. Patient states that her paresthesias are to the right side of her face, to her right arm down to her fingertips, and in her right leg from mid thigh down to her toes. Patient describes these paresthesias as burning and "it feels like it does when you're foot is asleep." Patient states that she is currently not dizzy. Patient states that her paresthesias feel like her fibromyalgia does, she was just concerned because she hasn't felt that on just one side before. Patient denies syncope, vision changes, nausea/vomiting, weakness or loss of function, or any other complaints.    Past Medical History  Diagnosis Date  . Fibromyalgia    Past Surgical History  Procedure Laterality Date  . Gastric bypass     No family history on file. Social History  Substance Use Topics  . Smoking status: Never Smoker   . Smokeless tobacco: None  . Alcohol Use: None   OB History    No data available     Review of Systems  Constitutional: Negative for fever and chills.  Musculoskeletal: Negative for back pain and neck pain.  Neurological: Positive for headaches. Negative for syncope, facial asymmetry, speech difficulty, weakness and numbness.       Burning and tingling to the right  side of the face, right arm, and right leg.  All other systems reviewed and are negative.     Allergies  Codeine  Home Medications   Prior to Admission medications   Medication Sig Start Date End Date Taking? Authorizing Provider  Acetaminophen-Caffeine (TENSION HEADACHE PO) Take 2 tablets by mouth daily as needed. For headache   Yes Historical Provider, MD  ALPRAZolam (XANAX) 0.25 MG tablet Take 0.125 mg by mouth 3 (three) times daily as needed. For anxiety   Yes Historical Provider, MD  buPROPion (WELLBUTRIN) 100 MG tablet Take 100 mg by mouth 3 (three) times daily.   Yes Historical Provider, MD  dextroamphetamine (DEXTROSTAT) 10 MG tablet Take 5 mg by mouth 3 (three) times daily.   Yes Historical Provider, MD  ibuprofen (ADVIL,MOTRIN) 200 MG tablet Take 800 mg by mouth every 8 (eight) hours as needed. For pain   Yes Historical Provider, MD  losartan-hydrochlorothiazide (HYZAAR) 50-12.5 MG tablet Take 1 tablet by mouth 2 (two) times daily. 06/06/15  Yes Historical Provider, MD  sertraline (ZOLOFT) 100 MG tablet Take 200 mg by mouth daily.   Yes Historical Provider, MD   BP 139/80 mmHg  Pulse 80  Temp(Src) 98.3 F (36.8 C) (Oral)  Resp 16  SpO2 97% Physical Exam  Constitutional: She is oriented to person, place, and time. She appears well-developed and well-nourished. No distress.  HENT:  Head: Normocephalic and atraumatic.  Eyes: Conjunctivae and EOM are normal. Pupils are equal, round, and reactive to light.  Neck: Normal range of motion. Neck supple.  Cardiovascular: Normal rate, regular rhythm and normal heart sounds.   Pulmonary/Chest: Effort normal and breath sounds normal. No respiratory distress.  Abdominal: Soft. Bowel sounds are normal.  Musculoskeletal: She exhibits no edema or tenderness.  Full ROM in all extremities and spine. No paraspinal tenderness.   Lymphadenopathy:    She has no cervical adenopathy.  Neurological: She is alert and oriented to person, place,  and time. She has normal reflexes.  No sensory deficits. Strength 5/5 in all extremities. No gait disturbance. Coordination intact. Cranial nerves III-XII grossly intact. No facial droop. Left eye droop is noted and patient states this is normal for her. Negative Romberg. Patient readily stands up and walks confidently up and down the hall.  Skin: Skin is warm and dry. She is not diaphoretic.  Nursing note and vitals reviewed.   ED Course  Procedures (including critical care time) Labs Review Labs Reviewed  CBC - Abnormal; Notable for the following:    Hemoglobin 11.0 (*)    HCT 35.3 (*)    MCH 24.4 (*)    RDW 15.7 (*)    Platelets 425 (*)    All other components within normal limits  COMPREHENSIVE METABOLIC PANEL - Abnormal; Notable for the following:    Glucose, Bld 130 (*)    Albumin 3.2 (*)    Alkaline Phosphatase 135 (*)    Total Bilirubin <0.1 (*)    All other components within normal limits  I-STAT CHEM 8, ED - Abnormal; Notable for the following:    Glucose, Bld 130 (*)    Calcium, Ion 1.24 (*)    All other components within normal limits  PROTIME-INR  APTT  DIFFERENTIAL  I-STAT TROPOININ, ED  CBG MONITORING, ED    Imaging Review Ct Head Wo Contrast  06/13/2015  CLINICAL DATA:  Right-sided weakness. Headache and blurred vision for 1 day EXAM: CT HEAD WITHOUT CONTRAST TECHNIQUE: Contiguous axial images were obtained from the base of the skull through the vertex without intravenous contrast. COMPARISON:  Brain MRI January 16, 2009 ; head CT Oct 11, 2002 FINDINGS: The ventricles are normal in size and configuration. 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 There is no intracranial mass, hemorrhage, extra-axial fluid collection, or midline shift. There is a small focus of calcification in the midline of the pons, stable. Elsewhere gray-white compartments are normal. Bony calvarium appears intact. The mastoid air cells are clear. No intraorbital lesion identified.  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 No intracranial mass, hemorrhage, or focal gray -white compartment lesions/acute appearing infarct. Small focus of calcification anterior mid pons is stable and of doubtful clinical significance. Electronically Signed   By: Bretta Bang III M.D.   On: 06/13/2015 13:45   I have personally reviewed and evaluated these images and lab results as part of my medical decision-making.   EKG Interpretation None      MDM   Final diagnoses:  Acute nonintractable headache, unspecified headache type    [facility] AMIRA PODOLAK presents with a headache and paresthesias that began today.  Findings and plan of care discussed with Lyndal Pulley, MD.  This patient's symptoms are more consistent with a migraine and/or a headache combined with her fibromyalgia. Patient is not ill-appearing, has no focal neural deficits, has cognition fully intact, and is in no apparent distress. Migraine cocktail will be administered to the patient. Head CT shows no acute abnormality. Patient eloped before any treatment could  be initiated.    Anselm Pancoast, PA-C 06/13/15 1737  Lyndal Pulley, MD 06/14/15 [phone]

## 2015-06-13 NOTE — ED Notes (Signed)
Pt eloped. No interventions completed for this pt.

## 2017-02-28 ENCOUNTER — Emergency Department
Admission: EM | Admit: 2017-02-28 | Discharge: 2017-02-28 | Disposition: A | Discharge status: Home / Self Care | Payer: BC Managed Care – PPO | Attending: Emergency Medicine | Admitting: Emergency Medicine

## 2017-02-28 ENCOUNTER — Encounter: Payer: Self-pay | Admitting: Emergency Medicine

## 2017-02-28 ENCOUNTER — Emergency Department: Payer: BC Managed Care – PPO

## 2017-02-28 DIAGNOSIS — Y939 Activity, unspecified: Secondary | ICD-10-CM | POA: Insufficient documentation

## 2017-02-28 DIAGNOSIS — Y929 Unspecified place or not applicable: Secondary | ICD-10-CM | POA: Insufficient documentation

## 2017-02-28 DIAGNOSIS — R1010 Upper abdominal pain, unspecified: Secondary | ICD-10-CM | POA: Insufficient documentation

## 2017-02-28 DIAGNOSIS — E119 Type 2 diabetes mellitus without complications: Secondary | ICD-10-CM | POA: Diagnosis not present

## 2017-02-28 DIAGNOSIS — Y999 Unspecified external cause status: Secondary | ICD-10-CM | POA: Diagnosis not present

## 2017-02-28 DIAGNOSIS — Z79899 Other long term (current) drug therapy: Secondary | ICD-10-CM | POA: Insufficient documentation

## 2017-02-28 DIAGNOSIS — M797 Fibromyalgia: Secondary | ICD-10-CM | POA: Insufficient documentation

## 2017-02-28 HISTORY — DX: Type 2 diabetes mellitus without complications: E11.9

## 2017-02-28 LAB — CBC
HCT: 38.5 % (ref 35.0–47.0)
HEMOGLOBIN: 12.2 g/dL (ref 12.0–16.0)
MCH: 23.4 pg — AB (ref 26.0–34.0)
MCHC: 31.7 g/dL — AB (ref 32.0–36.0)
MCV: 73.7 fL — ABNORMAL LOW (ref 80.0–100.0)
Platelets: 407 10*3/uL (ref 150–440)
RBC: 5.22 MIL/uL — ABNORMAL HIGH (ref 3.80–5.20)
RDW: 16.4 % — AB (ref 11.5–14.5)
WBC: 6.4 10*3/uL (ref 3.6–11.0)

## 2017-02-28 LAB — COMPREHENSIVE METABOLIC PANEL
ALK PHOS: 152 U/L — AB (ref 38–126)
ALT: 21 U/L (ref 14–54)
ANION GAP: 7 (ref 5–15)
AST: 24 U/L (ref 15–41)
Albumin: 3.5 g/dL (ref 3.5–5.0)
BUN: 11 mg/dL (ref 6–20)
CO2: 26 mmol/L (ref 22–32)
Calcium: 10 mg/dL (ref 8.9–10.3)
Chloride: 106 mmol/L (ref 101–111)
Creatinine, Ser: 0.57 mg/dL (ref 0.44–1.00)
GFR calc non Af Amer: >60 mL/min (ref >60)
Glucose, Bld: 168 mg/dL — ABNORMAL HIGH (ref 65–99)
POTASSIUM: 4.2 mmol/L (ref 3.5–5.1)
Sodium: 139 mmol/L (ref 135–145)
Total Bilirubin: 0.4 mg/dL (ref 0.3–1.2)
Total Protein: 8.3 g/dL — ABNORMAL HIGH (ref 6.5–8.1)

## 2017-02-28 LAB — LIPASE, BLOOD: Lipase: 19 U/L (ref 11–51)

## 2017-02-28 LAB — POCT PREGNANCY, URINE: PREG TEST UR: NEGATIVE

## 2017-02-28 MED ORDER — IOPAMIDOL (ISOVUE-300) INJECTION 61%
125.0000 mL | Freq: Once | INTRAVENOUS | Status: AC | PRN
  Administered 2017-02-28: 13:00:00 via INTRAVENOUS

## 2017-02-28 NOTE — ED Notes (Signed)
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
## 2017-02-28 NOTE — Discharge Instructions (Signed)

## 2017-05-03 IMAGING — CT CT HEAD W/O CM
2 series · 15 of 30 positions shown, 17 images · non-contrast
Comparison: Brain MRI January 16, 2009 ; head CT October 11, 2002

CLINICAL DATA: Right-sided weakness. Headache and blurred vision
for 1 day

EXAM:
CT HEAD WITHOUT CONTRAST
TECHNIQUE: Contiguous axial images were obtained from the base of the skull
through the vertex without intravenous contrast.

[Series 2: head without · axial · non-contrast · 0.42mm/px · z∈[-97,+18]mm · 7 of 31 slices shown, 9 images]
[im 4/31  brain]
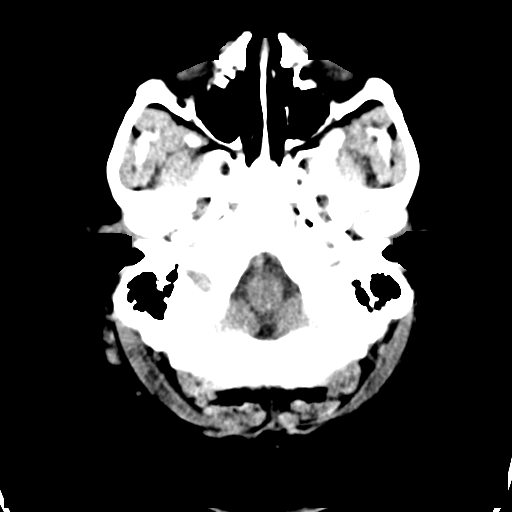
[im 4/31  bone]
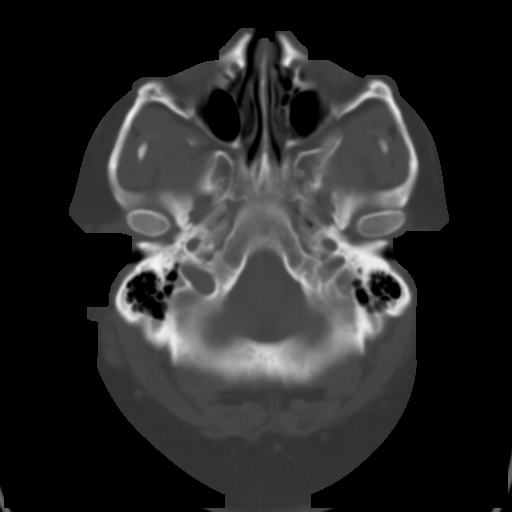
[im 8/31  brain]
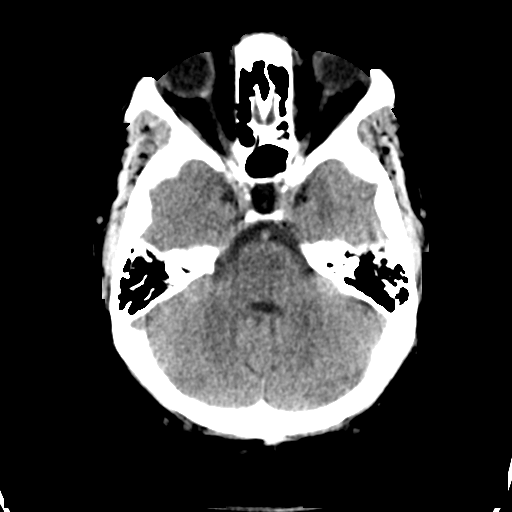
[im 12/31  brain]
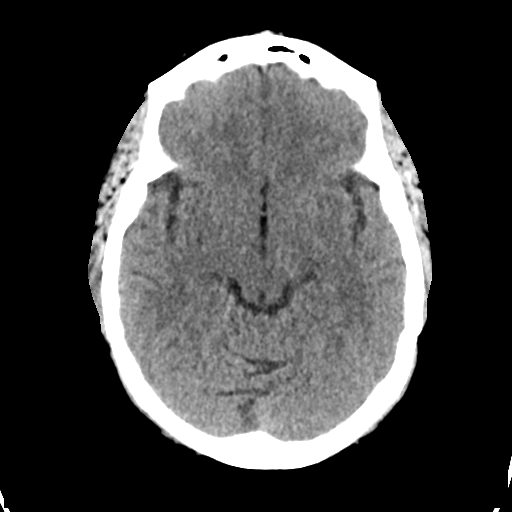
[im 16/31  brain]
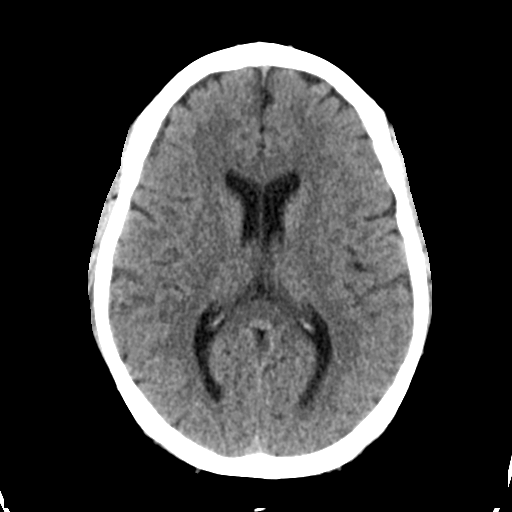
[im 19/31  brain]
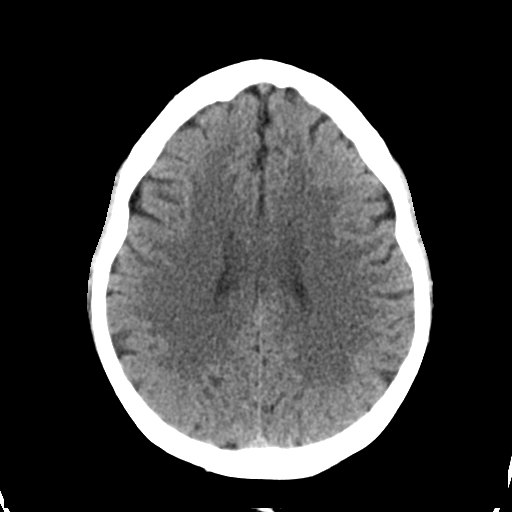
[im 19/31  bone]
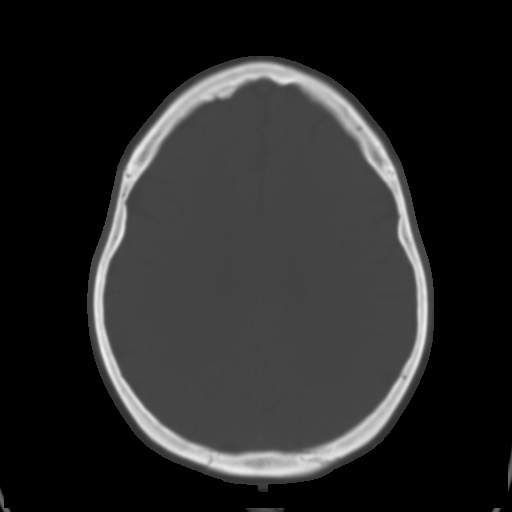
[im 23/31  brain]
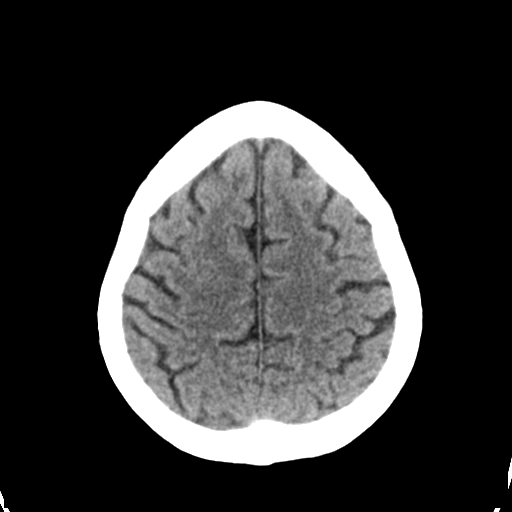
[im 27/31  brain]
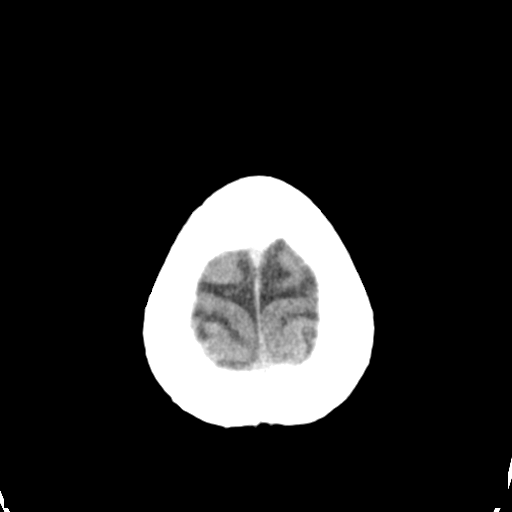

[Series 3: head bone · axial · 0.42mm/px · z∈[-98,+26]mm · 8 of 78 slices shown]
[im 8/78  bone]
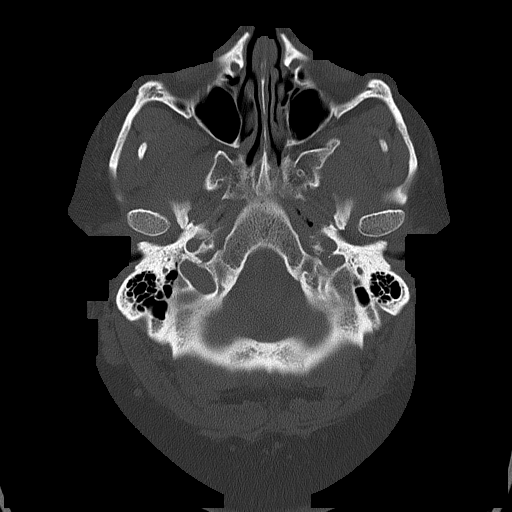
[im 16/78  bone]
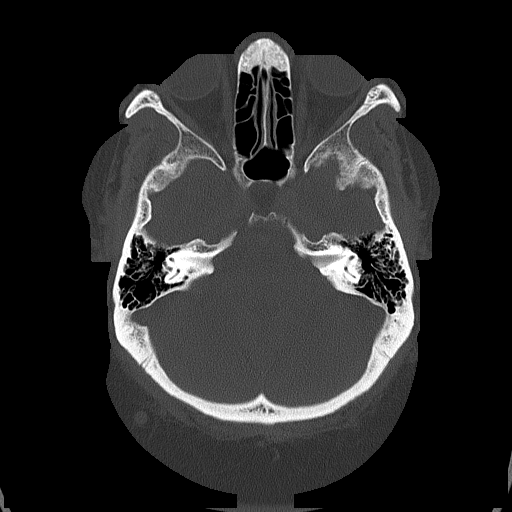
[im 24/78  bone]
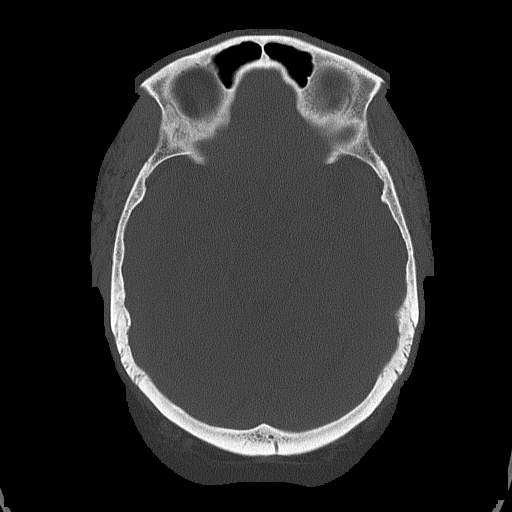
[im 35/78  bone]
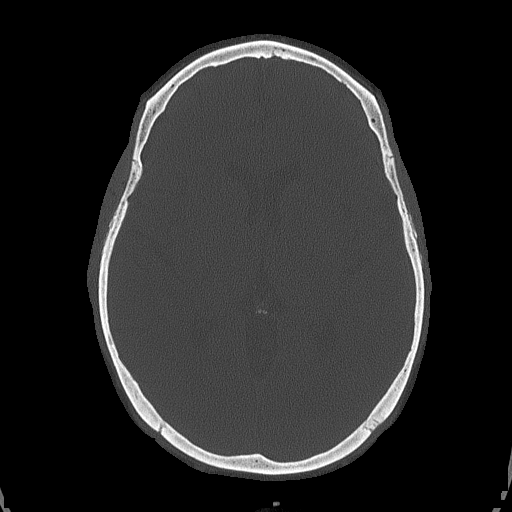
[im 43/78  bone]
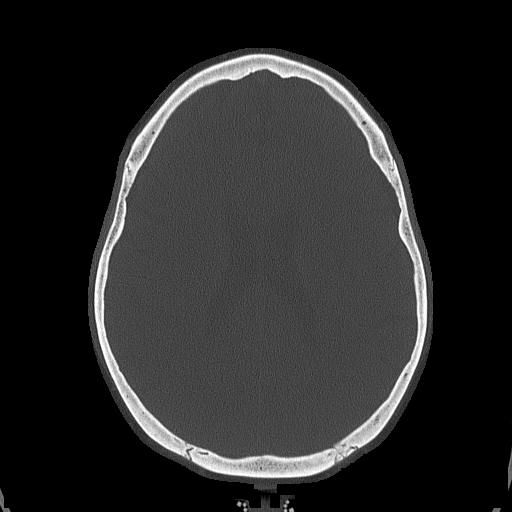
[im 54/78  bone]
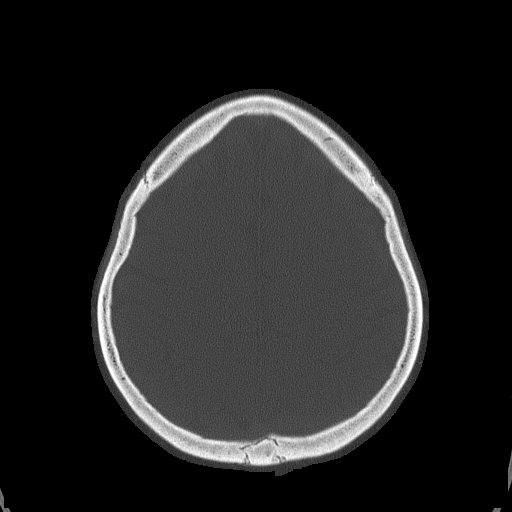
[im 62/78  bone]
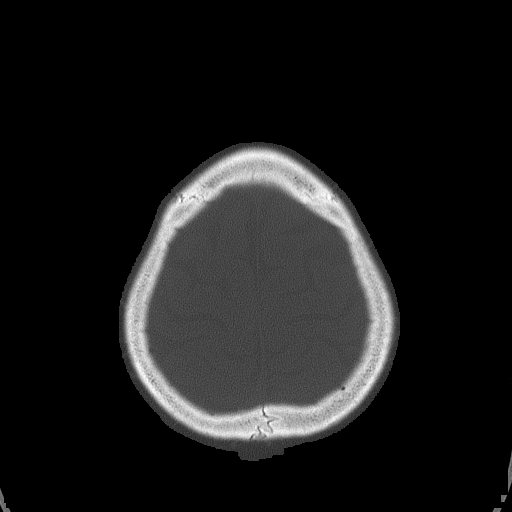
[im 70/78  bone]
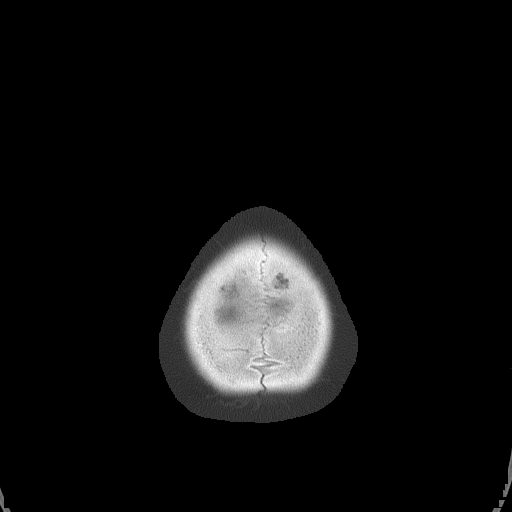

[15 of 30 positions shown; findings below may reference images not displayed]

FINDINGS: The ventricles are normal in size and configuration. There is
invagination of CSF into the sella. There is no intracranial mass,
hemorrhage, extra-axial fluid collection, or midline shift. There is
a small focus of calcification in the midline of the pons, stable.
Elsewhere gray-white compartments are normal. Bony calvarium appears
intact. The mastoid air cells are clear. No intraorbital lesion
identified.
IMPRESSION: There is a degree of empty sella, stable. No intracranial mass,
hemorrhage, or focal gray -white compartment lesions/acute appearing
infarct. Small focus of calcification anterior mid pons is stable
and of doubtful clinical significance.

## 2020-02-06 ENCOUNTER — Ambulatory Visit: Payer: BC Managed Care – PPO | Admitting: Neurology

## 2020-02-06 NOTE — Progress Notes (Signed)
[facility]    Provider:  Dr Lucia Gaskins Requesting Provider: Michelle Nasuti Primary Care Provider:  Cheron Schaumann., MD  CC: Muscle weakness and pain for the last 30 to 40 years  HPI:  Katrina Ellison is a 54 y.o. female here as requested by Michelle Nasuti for muscle weakness and gait instability. Past medical history ADHD, chronic pain syndrome, diabetes, daytime somnolence, hypertension, fibromyalgia, anxiety, GERD, history of bariatric surgery, depression, morbid obesity, panic disorder. I reviewed Katrina Ellison's notes: Patient is on naltrexone for chronic pain and at last visit wanted to know her "inflammation levels" and reported "random spasms in her calves, shoulders, fingers and toes ". Patient reported that her naltrexone was working well, reduction in her pain and is overall feeling better, she reported muscle pain and weakness that she had for the past "30 to 40 years and reports "thousands will occur in her calves, shoulders, fingers and toes", she endorsed some muscle weakness and reports occasionally get "the drop sees "which she describes as frequently dropping things from her hands, also some gait instability and imbalance, prior head CT performed in 2017 showed degree of empty sella but otherwise normal, she reported getting tested for multiple sclerosis when she was young and this was negative. 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  Patient is here alone. She says she has had symptoms for 30-40 years. Bother her shoulders would freeze up when she was cold. Also has had cramps int he fingers. She had imaging in teenage years oif the brain and was negative. In 2008-ish she had a stomach virus and she went to the ED and had numbness in her calf and went away  just one. She was diagnosed with Fibromyalgia. She had an MRI of the brain and she was negative for MS and told her she had PCOS. 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 Currently she has ocasional eye twitching, a throat "catch", topical pains in the hands more when she is tired, tingling in the feet again occasionally at the end of the day, all symptoms fully resolvable and not severe. She feels she needs better adhd medications for her fogginess. She has depression and anxiety and is on zoloft. She has fibromyalgia. Symptoms are not progressive. No other focal neurologic deficits, associated symptoms, inciting events or modifiable factors.  Reviewed notes, labs and imaging from outside physicians, which showed:  IMPRESSION: CT head 2017 There is a degree of empty sella, stable. No intracranial mass, hemorrhage, or focal gray -white compartment lesions/acute appearing infarct. Small focus of calcification anterior mid pons is stable and of doubtful clinical significance.personally reviewed imaging and agree  Review of Systems: Patient complains of symptoms per HPI as well as the following symptoms: muscle cramps, fibromyalgia, numbness, tingling, adhd, fatigue. Pertinent negatives and positives per HPI. All others negative.   Social History   Socioeconomic History  . Marital status: Single    Spouse name: Not on file  . Number of children: 0  . Years of education: Not on file  . Highest education level: Bachelor's degree (e.g., BA, AB, BS)  Occupational History  . Not on file  Tobacco Use  . Smoking status: Never  Smoker  . Smokeless tobacco: Never Used  Substance and Sexual Activity  . Alcohol use: No  . Drug use: No  . Sexual activity: Not on file  Other Topics Concern  . Not on file  Social History Narrative   Lives alone   Right handed   1 cup of  caffeine/daily   Social Determinants of Health   Financial Resource Strain:   . Difficulty of Paying Living Expenses: Not on file  Food Insecurity:   . Worried About Programme researcher, broadcasting/film/videounning Out of Food in the Last Year: Not on file  . Ran Out of Food in the Last Year: Not on file  Transportation Needs:   . Lack of Transportation (Medical): Not on file  . Lack of Transportation (Non-Medical): Not on file  Physical Activity:   . Days of Exercise per Week: Not on file  . Minutes of Exercise per Session: Not on file  Stress:   . Feeling of Stress : Not on file  Social Connections:   . Frequency of Communication with Friends and Family: Not on file  . Frequency of Social Gatherings with Friends and Family: Not on file  . Attends Religious Services: Not on file  . Active Member of Clubs or Organizations: Not on file  . Attends BankerClub or Organization Meetings: Not on file  . Marital Status: Not on file  Intimate Partner Violence:   . Fear of Current or Ex-Partner: Not on file  . Emotionally Abused: Not on file  . Physically Abused: Not on file  . Sexually Abused: Not on file    Family History  Problem Relation Age of Onset  . Stroke Mother   . Heart attack Mother   . Heart disease Mother   . Hyperlipidemia Mother   . Hypertension Mother   . AAA (abdominal aortic aneurysm) Father   . Heart attack Maternal Grandmother   . Heart disease Maternal Grandmother   . Hyperlipidemia Maternal Grandmother   . Hypertension Maternal Grandmother     Past Medical History:  Diagnosis Date  . ADD (attention deficit disorder)   . Anemia   . Chronic pain syndrome   . Diabetes mellitus without complication (HCC)    type II  . Fibromyalgia   . GAD (generalized anxiety disorder)   . GERD (gastroesophageal reflux disease)   . Herpetic gingivostomatitis   . Hiatal hernia   . Major depressive disorder   . Panic disorder without agoraphobia   . Polycystic ovaries   . Vitamin D deficiency     Patient  Active Problem List   Diagnosis Date Noted  . Pain in both hands 02/07/2020  . Numbness and tingling in both hands 02/07/2020  . Fibromyalgia   . DIABETES MELLITUS 12/10/2008  . HYPOGLYCEMIA 12/10/2008  . 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 OBESITY 12/10/2008  . ANEMIA, IRON DEFICIENCY 12/10/2008  . PANIC ATTACK 12/10/2008  . DEPRESSION 12/10/2008  . ATTENTION DEFICIT DISORDER 12/10/2008  . COPD 12/10/2008  . GERD 12/10/2008  . HIATAL HERNIA 12/10/2008  . FIBROMYALGIA 12/10/2008    Past Surgical History:  Procedure Laterality Date  . GASTRIC BYPASS    . KNEE ARTHROSCOPY W/ MENISCAL REPAIR Right     Current Outpatient Medications  Medication Sig Dispense Refill  . ALPRAZolam (XANAX) 0.25 MG tablet Take 0.125 mg by mouth 3 (three) times daily as needed. For anxiety    . amphetamine-dextroamphetamine (ADDERALL) 30 MG tablet Take 30 mg by mouth daily.    . empagliflozin (JARDIANCE) 25 MG  TABS tablet Take by mouth daily.    . fluticasone (FLONASE) 50 MCG/ACT nasal spray Place into both nostrils daily.    Marland Kitchen ibuprofen (ADVIL,MOTRIN) 200 MG tablet Take 800 mg by mouth every 8 (eight) hours as needed. For pain    . Lancets 30G MISC by Does not apply route.    . meclizine (ANTIVERT) 12.5 MG tablet Take 12.5 mg by mouth 3 (three) times daily as needed for dizziness.    . naltrexone (DEPADE) 50 MG tablet Take 3 mg by mouth daily.     . promethazine (PHENERGAN) 25 MG tablet Take 25 mg by mouth every 6 (six) hours as needed for nausea or vomiting.    . pseudoephedrine (SUDAFED) 30 MG tablet Take 30 mg by mouth every 4 (four) hours as needed for congestion.    Marland Kitchen SEMAGLUTIDE,0.25 OR 0.5MG /DOS, [place] Inject into the skin.    Marland Kitchen sertraline (ZOLOFT) 100 MG tablet Take 200 mg by mouth daily.    Marland Kitchen tiZANidine (ZANAFLEX) 4 MG tablet Take 4 mg by mouth every 6 (six) hours as needed for muscle spasms.     No current facility-administered medications for this visit.    Allergies as of 02/07/2020 - Review  Complete 02/07/2020  Allergen Reaction Noted  . Codeine    . Escitalopram Other (See Comments) 02/07/2020    Vitals: BP (!) 142/66 (BP Location: Right Arm, Patient Position: Sitting)   Pulse 96   Ht 5\' 4"  (1.626 m)   Wt 299 lb 9.6 oz (135.9 kg)   BMI 51.43 kg/m  Last Weight:  Wt Readings from Last 1 Encounters:  02/07/20 299 lb 9.6 oz (135.9 kg)   Last Height:   Ht Readings from Last 1 Encounters:  02/07/20 5\' 4"  (1.626 m)     Physical exam: Exam: Gen: NAD, conversant, well nourised, obese, well groomed                     CV: RRR, no MRG. No Carotid Bruits. No peripheral edema, warm, nontender Eyes: Conjunctivae clear without exudates or hemorrhage  Neuro: Detailed Neurologic Exam  Speech:    Speech is normal; fluent and spontaneous with normal comprehension.  Cognition:    The patient is oriented to person, place, and time;     recent and remote memory intact;     language fluent;     normal attention, concentration,     fund of knowledge Cranial Nerves:    The pupils are equal, round, and reactive to light. The fundi are flat Visual fields are full to finger confrontation. Extraocular movements are intact. Trigeminal sensation is intact and the muscles of mastication are normal. Left ptosis (congenital), otherwise face is symmetric. The palate elevates in the midline. Hearing intact. Voice is normal. Shoulder shrug is normal. The tongue has normal motion without fasciculations.   Coordination:    Normal finger to nose and heel to shin.   Gait:    Heel-toe normal and tandem gait intact with minima imbalance.   Motor Observation:    No asymmetry, no atrophy, and no involuntary movements noted. Tone:    Normal muscle tone.    Posture:    Posture is normal. normal erect    Strength:    Strength is V/V in the upper and lower limbs.      Sensation: intact to LT     Reflex Exam:  DTR's:    Deep tendon reflexes in the upper and lower extremities are normal  bilaterally.   Toes:    The toes are downgoing bilaterally.   Clonus:    Clonus is absent.    Assessment/Plan:   54 y.o. lovely and funny female here as requested by Michelle Nasuti for muscle weakness and gait instability. Past medical history ADHD, chronic pain syndrome, diabetes, daytime somnolence, hypertension, fibromyalgia, anxiety, GERD, history of bariatric surgery, depression, morbid obesity, panic disorder. She has had multiple brain imaging in the past. She has been to neurologists int eh past for workup without any neurologic diagnoses.   Neurologic exam is normal. Her symptoms are not concerning for a primary neurologic conditions Currently she has ocasional eye twitching, a throat "catch", topical pains in the hands more when she is tired, tingling in the feet again occasionally at the end of the day, all symptoms fully resolvable and not severe. She has the "dropsies", her knees will buckle. She has numbness and tingling in the fingers.  We can perform emg/ncs of the hands to eval for numbness tingling however I don;t think she has any significant neurologic disease.   No orders of the defined types were placed in this encounter.  No orders of the defined types were placed in this encounter.   Cc: Unice Bailey., MD Katrina Ermelinda Das, MD  [facility] [address] [place], [place] [address]  Phone [phone] Fax [phone]

## 2020-02-07 ENCOUNTER — Ambulatory Visit: Payer: BC Managed Care – PPO | Admitting: Neurology

## 2020-02-07 ENCOUNTER — Encounter: Payer: Self-pay | Admitting: Emergency Medicine

## 2020-02-07 ENCOUNTER — Encounter: Payer: Self-pay | Admitting: Neurology

## 2020-02-07 VITALS — BP 142/66 | HR 96 | Ht 64.0 in | Wt 299.6 lb

## 2020-02-07 DIAGNOSIS — R202 Paresthesia of skin: Secondary | ICD-10-CM | POA: Insufficient documentation

## 2020-02-07 DIAGNOSIS — R200 Anesthesia of skin: Secondary | ICD-10-CM

## 2020-02-07 DIAGNOSIS — M79641 Pain in right hand: Secondary | ICD-10-CM | POA: Diagnosis not present

## 2020-02-07 DIAGNOSIS — M79642 Pain in left hand: Secondary | ICD-10-CM

## 2020-02-07 NOTE — Patient Instructions (Signed)
EMG/NCS   Electromyoneurogram Electromyoneurogram is a test to check how well your muscles and nerves are working. This procedure includes the combined use of electromyogram (EMG) and nerve conduction study (NCS). EMG is used to look for muscular disorders. NCS, which is also called electroneurogram, measures how well your nerves are controlling your muscles. The procedures are usually done together to check if your muscles and nerves are healthy. If the results of the tests are abnormal, this may indicate disease or injury, such as a neuromuscular disease or peripheral nerve damage. Tell a health care provider about:  Any allergies you have.  All medicines you are taking, including vitamins, herbs, eye drops, creams, and over-the-counter medicines.  Any problems you or family members have had with anesthetic medicines.  Any blood disorders you have.  Any surgeries you have had.  Any medical conditions you have.  If you have a pacemaker.  Whether you are pregnant or may be pregnant. What are the risks? Generally, this is a safe procedure. However, problems may occur, including:  Infection where the electrodes were inserted.  Bleeding. What happens before the procedure? Medicines Ask your health care provider about:  Changing or stopping your regular medicines. This is especially important if you are taking diabetes medicines or blood thinners.  Taking medicines such as aspirin and ibuprofen. These medicines can thin your blood. Do not take these medicines unless your health care provider tells you to take them.  Taking over-the-counter medicines, vitamins, herbs, and supplements. General instructions  Your health care provider may ask you to avoid: ? Beverages that have caffeine, such as coffee and tea. ? Any products that contain nicotine or tobacco. These products include cigarettes, e-cigarettes, and chewing tobacco. If you need help quitting, ask your health care  provider.  Do not use lotions or creams on the same day that you will be having the procedure. What happens during the procedure? For EMG   Your health care provider will ask you to stay in a position so that he or she can access the muscle that will be studied. You may be standing, sitting, or lying down.  You may be given a medicine that numbs the area (local anesthetic).  A very thin needle that has an electrode will be inserted into your muscle.  Another small electrode will be placed on your skin near the muscle.  Your health care provider will ask you to continue to remain still.  The electrodes will send a signal that tells about the electrical activity of your muscles. You may see this on a monitor or hear it in the room.  After your muscles have been studied at rest, your health care provider will ask you to contract or flex your muscles. The electrodes will send a signal that tells about the electrical activity of your muscles.  Your health care provider will remove the electrodes and the electrode needles when the procedure is finished. The procedure may vary among health care providers and hospitals. For NCS   An electrode that records your nerve activity (recording electrode) will be placed on your skin by the muscle that is being studied.  An electrode that is used as a reference (reference electrode) will be placed near the recording electrode.  A paste or gel will be applied to your skin between the recording electrode and the reference electrode.  Your nerve will be stimulated with a mild shock. Your health care provider will measure how much time it takes for your  muscle to react.  Your health care provider will remove the electrodes and the gel when the procedure is finished. The procedure may vary among health care providers and hospitals. What happens after the procedure?  It is up to you to get the results of your procedure. Ask your health care provider,  or the department that is doing the procedure, when your results will be ready.  Your health care provider may: ? Give you medicines for any pain. ? Monitor the insertion sites to make sure that bleeding stops. Summary  Electromyoneurogram is a test to check how well your muscles and nerves are working.  If the results of the tests are abnormal, this may indicate disease or injury.  This is a safe procedure. However, problems may occur, such as bleeding and infection.  Your health care provider will do two tests to complete this procedure. One checks your muscles (EMG) and another checks your nerves (NCS).  It is up to you to get the results of your procedure. Ask your health care provider, or the department that is doing the procedure, when your results will be ready. This information is not intended to replace advice given to you by your health care provider. Make sure you discuss any questions you have with your health care provider. Document Revised: 01/18/2018 Document Reviewed: 12/31/2017 Elsevier Patient Education  [place].

## 2020-03-04 ENCOUNTER — Encounter: Payer: BC Managed Care – PPO | Admitting: Neurology

## 2023-12-01 NOTE — Progress Notes (Unsigned)
   LILLETTE Ileana Collet, PhD, LAT, ATC acting as a scribe for Artist Lloyd, MD.  Katrina Ellison is a 58 y.o. female who presents to [facility] at [facility] today for evaluation of her hypermobility.  MS: Skin/Immune reactions: Nervous system: Head/Spine: CV: GI: Genitourinary: Hands & Feet:  Pertinent review of systems: ***  Relevant historical information: ***   Exam:  There were no vitals taken for this visit. General: Well Developed, well nourished, and in no acute distress.   MSK: ***    Lab and Radiology Results No results found for this or any previous visit (from the past 72 hours). No results found.     Assessment and Plan: 58 y.o. female with ***   PDMP not reviewed this encounter. No orders of the defined types were placed in this encounter.  No orders of the defined types were placed in this encounter.    Discussed warning signs or symptoms. Please see discharge instructions. Patient expresses understanding.   ***

## 2023-12-02 ENCOUNTER — Ambulatory Visit: Payer: Self-pay | Admitting: Family Medicine

## 2023-12-02 VITALS — BP 118/80 | HR 68 | Ht 64.0 in | Wt 289.0 lb

## 2023-12-02 DIAGNOSIS — D8940 Mast cell activation, unspecified: Secondary | ICD-10-CM | POA: Diagnosis not present

## 2023-12-02 DIAGNOSIS — G901 Familial dysautonomia [Riley-Day]: Secondary | ICD-10-CM | POA: Diagnosis not present

## 2023-12-02 DIAGNOSIS — M357 Hypermobility syndrome: Secondary | ICD-10-CM

## 2023-12-02 MED ORDER — METOPROLOL TARTRATE 25 MG PO TABS: 12.5000 mg | ORAL_TABLET | Freq: Two times a day (BID) | ORAL | 3 refills | Status: AC

## 2023-12-02 NOTE — Patient Instructions (Addendum)
 Thank you for coming in today.   Check back in 2 months  Check out the book Disjointed Navigating the Diagnosis and Management of Hypermobile Ehlers-Danlos Syndrome and Hypermobility Spectrum Disorders.   Reminder: Dr. Joane will be out of the office starting August 1st, for about 6 weeks  I've sent a prescription for Metoprolol  to your pharmacy.   Try taking Zytec & Pepcid twice a day  I've referred you to Physical Therapy.  Let us  know if you don't hear from them in one week.

## 2023-12-03 ENCOUNTER — Other Ambulatory Visit: Payer: Self-pay | Admitting: Medical Genetics

## 2023-12-14 ENCOUNTER — Other Ambulatory Visit

## 2023-12-14 DIAGNOSIS — Z006 Encounter for examination for normal comparison and control in clinical research program: Secondary | ICD-10-CM

## 2023-12-25 LAB — GENECONNECT MOLECULAR SCREEN: Genetic Analysis Overall Interpretation: NEGATIVE

## 2024-02-08 ENCOUNTER — Ambulatory Visit: Admitting: Family Medicine

## 2024-02-08 ENCOUNTER — Encounter: Payer: Self-pay | Admitting: Family Medicine

## 2024-02-08 VITALS — BP 132/76 | HR 92 | Ht 64.0 in | Wt 297.0 lb

## 2024-02-08 DIAGNOSIS — D8940 Mast cell activation, unspecified: Secondary | ICD-10-CM | POA: Diagnosis not present

## 2024-02-08 DIAGNOSIS — G901 Familial dysautonomia [Riley-Day]: Secondary | ICD-10-CM

## 2024-02-08 DIAGNOSIS — M797 Fibromyalgia: Secondary | ICD-10-CM | POA: Diagnosis not present

## 2024-02-08 DIAGNOSIS — M357 Hypermobility syndrome: Secondary | ICD-10-CM | POA: Diagnosis not present

## 2024-02-08 MED ORDER — NALTREXONE HCL POWD: 3.0000 mg | Freq: Every day | 3 refills | Status: AC

## 2024-02-08 NOTE — Patient Instructions (Addendum)
 Thank you for coming in today.   Added low-dose Naltrexone .  Focus on fluids  See you back in 2 months.

## 2024-02-08 NOTE — Progress Notes (Unsigned)
   I, Leotis Batter, CMA acting as a scribe for Artist Lloyd, MD.  Katrina Ellison is a 58 y.o. female who presents to [facility] at [facility] today for f/u hypermobility syndrome, dysautonomia, and MCAS. Pt was last seen by Dr. Lloyd on 12/02/23 and was referred to [facility]. Also advised on salt/water intake, compression garments, and exercise. Prescribed famotidine and metoprolol .  Today, pt reports compliance with salt and fluid intake about 80% of the time, more cognoscente about it. Going for PT once weekly, feels that this has been helpful. Sx wax and wane, can have a really good week, followed by feeling muscle fatigue, general fatigue, and pain. Taking Xyzal but has not yet purchased Pepcid.   Pertinent review of systems: No fevers or chills  Relevant historical information: Hypermobility syndrome and dysautonomia.   Exam:  BP 132/76   Pulse 92   Ht 5' 4 (1.626 m)   Wt 297 lb (134.7 kg)   SpO2 98%   BMI 50.98 kg/m  General: Well Developed, well nourished, and in no acute distress.   MSK: Normal gait    Lab and Radiology Results No results found for this or any previous visit (from the past 72 hours). No results found.     Assessment and Plan: 58 y.o. 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 Overall improving but not fully improved. Plan to continue current regimen.  Will go ahead and add low-dose naltrexone .  Additionally spent some time talking about strategies to get adequate fluid and salt at work.  Additionally we talked about exercise program...  Recheck in 2 months.  PDMP reviewed during this encounter. No orders of the defined types were placed in this encounter.  Meds ordered this encounter  Medications   Naltrexone  HCl POWD    Sig: 3 mg by Does not apply route daily.    Dispense:  100 g    Refill:  3     Discussed warning signs or symptoms. Please see  discharge instructions. Patient expresses understanding.   The above documentation has been reviewed and is accurate and complete Artist Lloyd, M.D.

## 2024-03-01 ENCOUNTER — Encounter: Payer: Self-pay | Admitting: Family Medicine

## 2024-04-10 ENCOUNTER — Ambulatory Visit: Admitting: Family Medicine

## 2024-04-10 VITALS — BP 138/80 | HR 93 | Ht 64.0 in | Wt 299.0 lb

## 2024-04-10 DIAGNOSIS — M357 Hypermobility syndrome: Secondary | ICD-10-CM | POA: Diagnosis not present

## 2024-04-10 DIAGNOSIS — M797 Fibromyalgia: Secondary | ICD-10-CM | POA: Diagnosis not present

## 2024-04-10 NOTE — Progress Notes (Unsigned)
   I, Claretha Schimke am a scribe for Dr. Artist Lloyd, MD.  Lilia ALYZAH PELLY is a 58 y.o. female who presents to [facility] at [facility] today for f/u hypermobility syndrome, dysautonomia, MCAS, and fibromyalgia. Pt was last seen by Dr. Lloyd on 02/08/24 and was advised to cont current regimen. Also prescribed naltrexone .  Today, pt reports symptoms are doing well today. Small flare up in symptoms over the weekend. Left knee was doing the feeling where it felt like it was going to crumble.   Pertinent review of systems: ***  Relevant historical information: ***   Exam:  There were no vitals taken for this visit. General: Well Developed, well nourished, and in no acute distress.   MSK: ***    Lab and Radiology Results No results found for this or any previous visit (from the past 72 hours). No results found.     Assessment and Plan: 58 y.o. female with ***   PDMP not reviewed this encounter. No orders of the defined types were placed in this encounter.  No orders of the defined types were placed in this encounter.    Discussed warning signs or symptoms. Please see discharge instructions. Patient expresses understanding.   ***

## 2024-04-10 NOTE — Patient Instructions (Addendum)
 Thank you for coming in today.   Try an abdominal binder  Stop naltrexone   Check back in 2 months  Check out the book Disjointed Navigating the Diagnosis and Management of Hypermobile Ehlers-Danlos Syndrome and Hypermobility Spectrum Disorders.     For POTS symptoms: Consume 3 L water and 8-12 gram of salt per day    Check out these websites for exercises to try if you have POTS (CH[employer] Protocol, [place] Protocol, and [employer] Protocol) [email or web address].taylor-[email or web address]/ https://dean.info/    Guide to Prof. Russek's Patient Self-Care Handouts https://webspace.Http://www.black-smith.org/   Orthostatic Intolerance and Postural Orthostatic Tachycardia Self-Care Checklist https://webspace.Wvlyxc.com   Steps To Managing Your Mast Cell Activation Syndrome https://[email or web address].fi.pdf

## 2024-06-13 ENCOUNTER — Ambulatory Visit: Admitting: Family Medicine

## 2024-06-14 ENCOUNTER — Ambulatory Visit: Admitting: Family Medicine

## 2024-07-05 ENCOUNTER — Ambulatory Visit: Admitting: Family Medicine
